# Patient Record
Sex: Male | Born: 2000 | Race: White | Hispanic: No | Marital: Single | Smoking: Never smoker
Health system: Southern US, Community
[De-identification: ages and names within clinical notes are randomized; demographics above are authoritative.]

---
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
## 2023-06-12 ENCOUNTER — Other Ambulatory Visit (HOSPITAL_COMMUNITY): Payer: Self-pay
